# Patient Record
Sex: Male | Born: 2000 | Race: White | Hispanic: No | Marital: Single | Smoking: Never smoker
Health system: Southern US, Community
[De-identification: ages and names within clinical notes are randomized; demographics above are authoritative.]

## PROBLEM LIST (undated history)

## (undated) DIAGNOSIS — F419 Anxiety disorder, unspecified: Secondary | ICD-10-CM

---
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
## 2022-09-06 IMAGING — DX DG LUMBAR SPINE COMPLETE 4+V
5 series · 5 of 5 positions shown · non-contrast
Comparison: X-ray lumbar spine 04/02/2013.

CLINICAL DATA: Chronic low back pain since 2520. Has gotten
progressively worse over the past 2 years. No injury or trauma.

EXAM:
LUMBAR SPINE - COMPLETE 4+ VIEW

[l-spine ap]
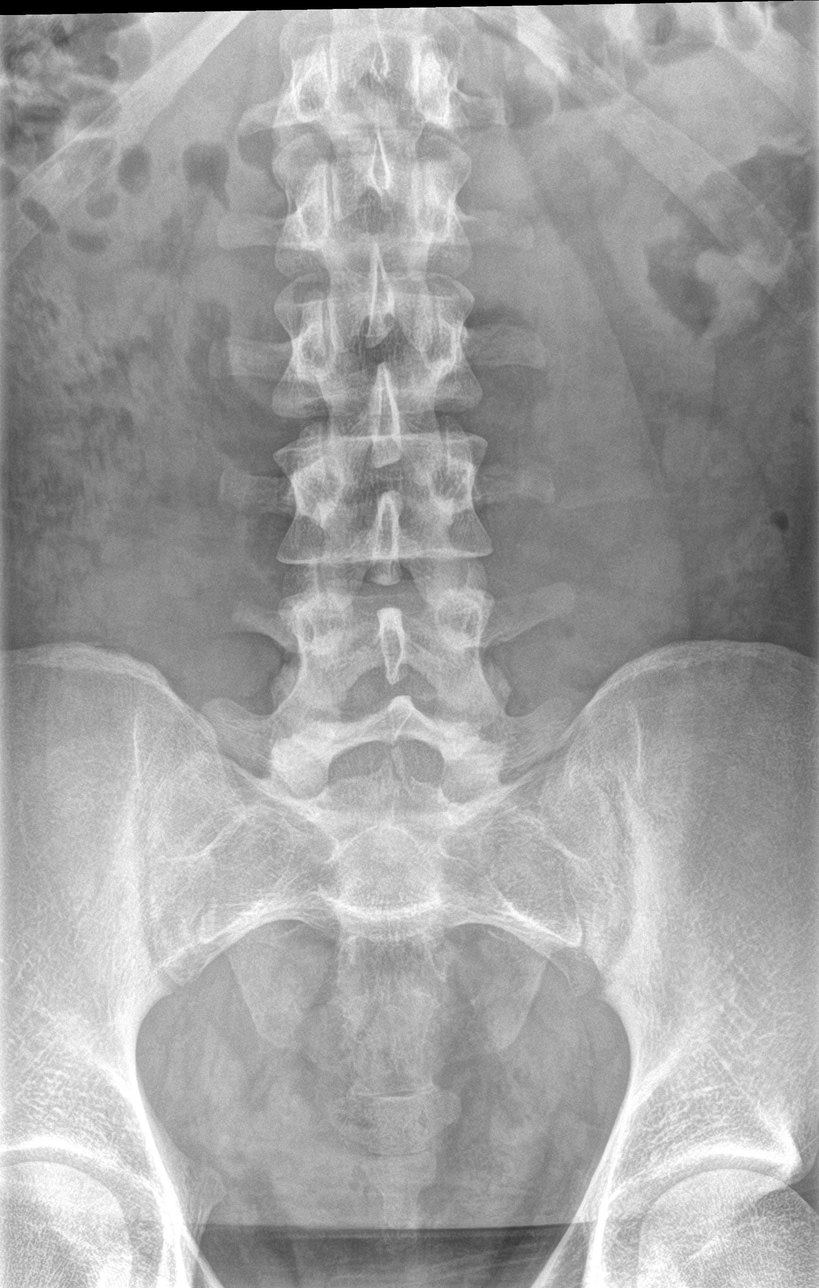

[l-spine obl (1 of 2)]
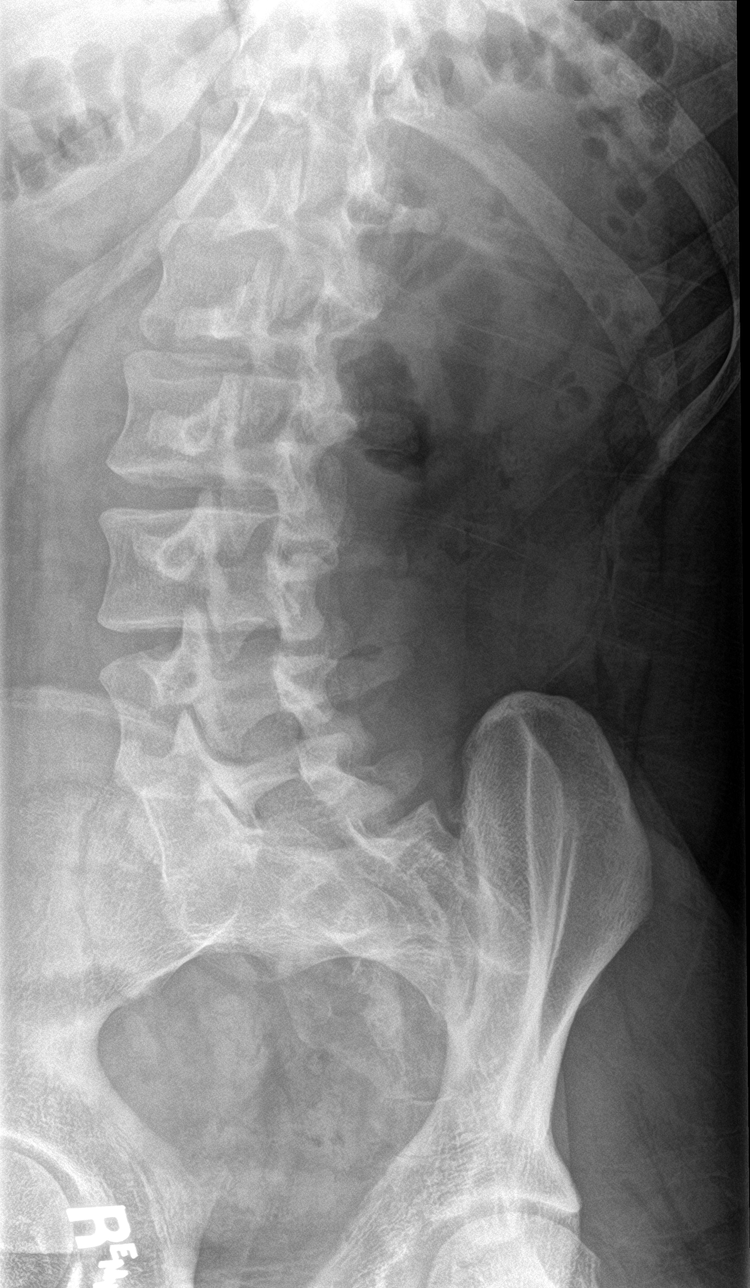

[l-spine obl (2 of 2)]
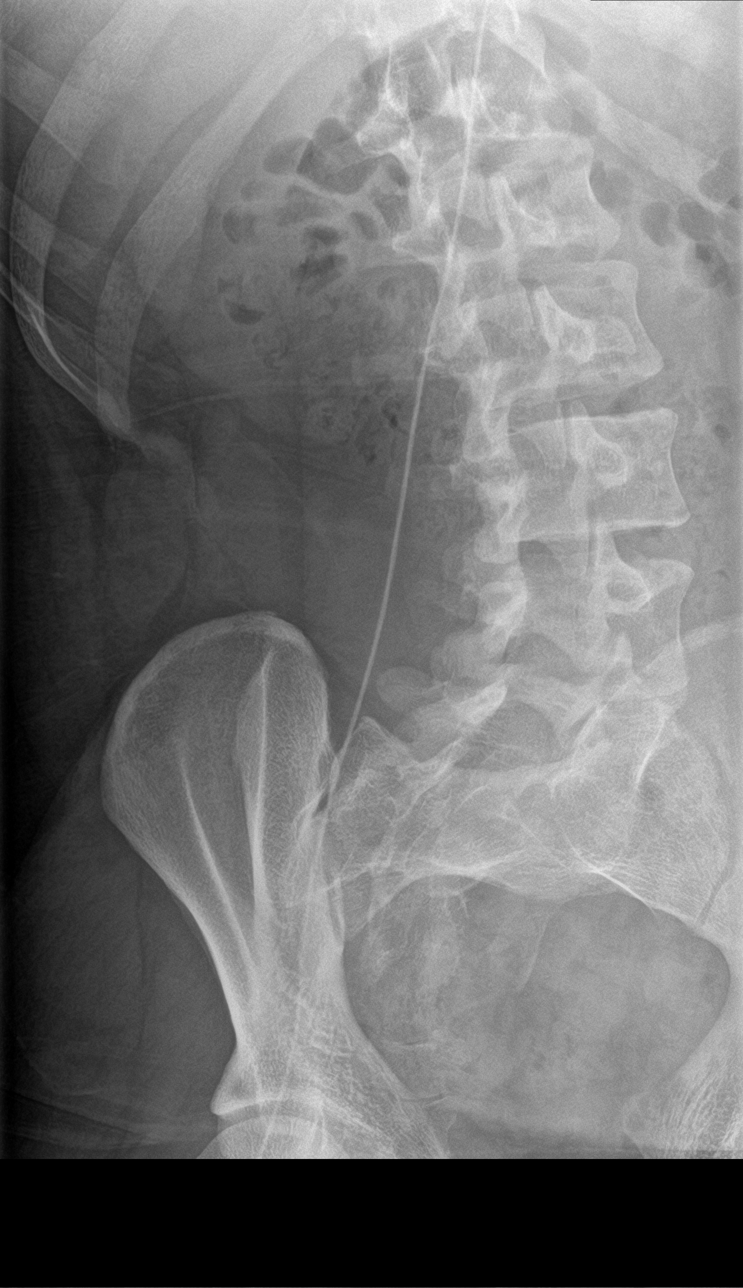

[l-spine lat]
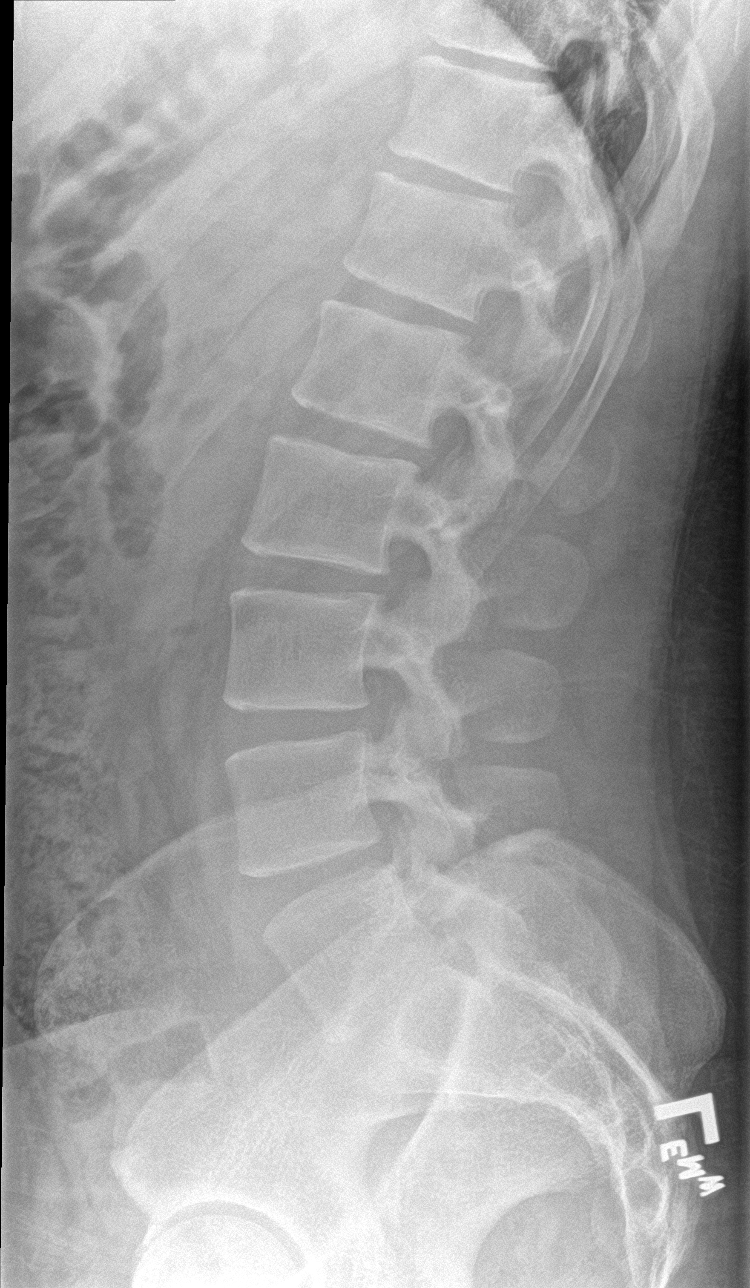

[l-spine spot]
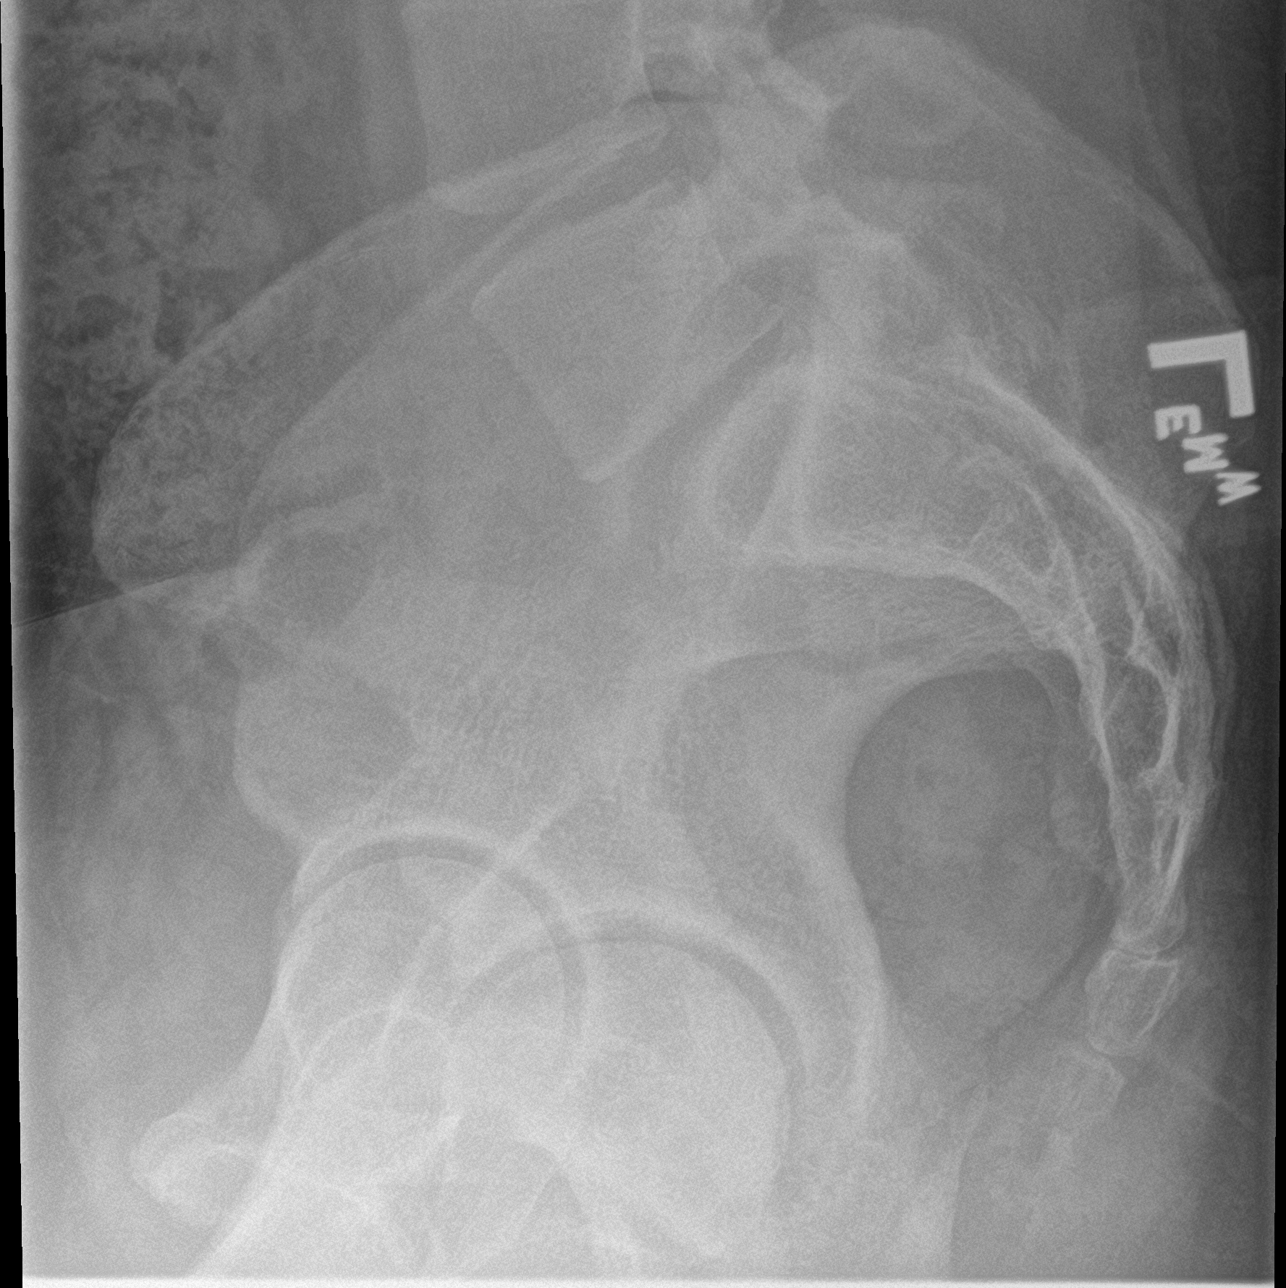

[5 of 5 positions shown; findings below may reference images not displayed]

FINDINGS: There is no evidence of lumbar spine fracture. Alignment is normal.
Intervertebral disc spaces are maintained.
IMPRESSION: Negative.
